# Patient Record
Sex: Female | Born: 1985 | Race: White | Hispanic: No | Marital: Married | Smoking: Never smoker
Health system: Southern US, Community
[De-identification: ages and names within clinical notes are randomized; demographics above are authoritative.]

## PROBLEM LIST (undated history)

## (undated) ENCOUNTER — Inpatient Hospital Stay (HOSPITAL_COMMUNITY): Payer: Self-pay

## (undated) DIAGNOSIS — Z789 Other specified health status: Secondary | ICD-10-CM

## (undated) DIAGNOSIS — O139 Gestational [pregnancy-induced] hypertension without significant proteinuria, unspecified trimester: Secondary | ICD-10-CM

## (undated) DIAGNOSIS — N83209 Unspecified ovarian cyst, unspecified side: Secondary | ICD-10-CM

## (undated) HISTORY — PX: NO PAST SURGERIES: SHX2092

## (undated) HISTORY — PX: WISDOM TOOTH EXTRACTION: SHX21

## (undated) HISTORY — PX: OTHER SURGICAL HISTORY: SHX169

---

## 2015-01-31 ENCOUNTER — Encounter: Payer: Self-pay | Admitting: Obstetrics and Gynecology

## 2015-05-20 NOTE — L&D Delivery Note (Signed)
Delivery Note Patient could not sit for epidural so Nitrous was given and a pudendal block was given. Patient then pushed for less than 30 minutes after she was noted to be C/C/+2. At 8:36 AM a viable and healthy female was delivered over an intact perineum ROA. APGAR:9/9 , ; weight pending. The umbilical cord was short so it was quickly double clamped and then cut by the father. Cord blood obtained. The placenta was spontaneously delivered intact, 3 vessels noted.  Patient's IV wasn't working so IM pitocin was given. Uterine atony continued despite massage so patient's IV was quickly replaced and IV pitocin administered. Several clots were removed from the uterus and hemostasis achieved.      Anesthesia:  Local Lidocaine / pudendal Lacerations: None  Suture Repair: N/A Est. Blood Loss (mL):  600 mL  Mom to postpartum.  Baby to Couplet care / Skin to Skin.  Essie HartINN, Ryan Palermo STACIA 02/13/2016, 9:03 AM

## 2015-07-31 LAB — OB RESULTS CONSOLE RUBELLA ANTIBODY, IGM: RUBELLA: IMMUNE

## 2015-07-31 LAB — OB RESULTS CONSOLE GC/CHLAMYDIA
CHLAMYDIA, DNA PROBE: NEGATIVE
Gonorrhea: NEGATIVE

## 2015-07-31 LAB — OB RESULTS CONSOLE RPR: RPR: NONREACTIVE

## 2015-07-31 LAB — OB RESULTS CONSOLE ANTIBODY SCREEN: ANTIBODY SCREEN: NEGATIVE

## 2015-07-31 LAB — OB RESULTS CONSOLE HEPATITIS B SURFACE ANTIGEN: HEP B S AG: NEGATIVE

## 2015-07-31 LAB — OB RESULTS CONSOLE ABO/RH: RH TYPE: POSITIVE

## 2015-07-31 LAB — OB RESULTS CONSOLE HIV ANTIBODY (ROUTINE TESTING): HIV: NONREACTIVE

## 2015-12-23 ENCOUNTER — Encounter (HOSPITAL_COMMUNITY): Payer: Self-pay

## 2015-12-23 ENCOUNTER — Inpatient Hospital Stay (HOSPITAL_COMMUNITY)
Admission: AD | Admit: 2015-12-23 | Discharge: 2015-12-23 | Disposition: A | Discharge status: Home / Self Care | Payer: 59 | Source: Ambulatory Visit | Attending: Obstetrics | Admitting: Obstetrics

## 2015-12-23 DIAGNOSIS — O368130 Decreased fetal movements, third trimester, not applicable or unspecified: Secondary | ICD-10-CM | POA: Diagnosis present

## 2015-12-23 DIAGNOSIS — O368131 Decreased fetal movements, third trimester, fetus 1: Secondary | ICD-10-CM

## 2015-12-23 DIAGNOSIS — Z3A31 31 weeks gestation of pregnancy: Secondary | ICD-10-CM | POA: Diagnosis not present

## 2015-12-23 HISTORY — DX: Other specified health status: Z78.9

## 2015-12-23 NOTE — Discharge Instructions (Signed)
Fetal Movement Counts  Patient Name: __________________________________________________ Patient Due Date: ____________________  Performing a fetal movement count is highly recommended in high-risk pregnancies, but it is good for every pregnant woman to do. Your health care provider may ask you to start counting fetal movements at 28 weeks of the pregnancy. Fetal movements often increase:  · After eating a full meal.  · After physical activity.  · After eating or drinking something sweet or cold.  · At rest.  Pay attention to when you feel the baby is most active. This will help you notice a pattern of your baby's sleep and wake cycles and what factors contribute to an increase in fetal movement. It is important to perform a fetal movement count at the same time each day when your baby is normally most active.   HOW TO COUNT FETAL MOVEMENTS  1. Find a quiet and comfortable area to sit or lie down on your left side. Lying on your left side provides the best blood and oxygen circulation to your baby.  2. Write down the day and time on a sheet of paper or in a journal.  3. Start counting kicks, flutters, swishes, rolls, or jabs in a 2-hour period. You should feel at least 10 movements within 2 hours.  4. If you do not feel 10 movements in 2 hours, wait 2-3 hours and count again. Look for a change in the pattern or not enough counts in 2 hours.  SEEK MEDICAL CARE IF:  · You feel less than 10 counts in 2 hours, tried twice.  · There is no movement in over an hour.  · The pattern is changing or taking longer each day to reach 10 counts in 2 hours.  · You feel the baby is not moving as he or she usually does.  Date: ____________ Movements: ____________ Start time: ____________ Finish time: ____________   Date: ____________ Movements: ____________ Start time: ____________ Finish time: ____________  Date: ____________ Movements: ____________ Start time: ____________ Finish time: ____________  Date: ____________ Movements:  ____________ Start time: ____________ Finish time: ____________  Date: ____________ Movements: ____________ Start time: ____________ Finish time: ____________  Date: ____________ Movements: ____________ Start time: ____________ Finish time: ____________  Date: ____________ Movements: ____________ Start time: ____________ Finish time: ____________  Date: ____________ Movements: ____________ Start time: ____________ Finish time: ____________   Date: ____________ Movements: ____________ Start time: ____________ Finish time: ____________  Date: ____________ Movements: ____________ Start time: ____________ Finish time: ____________  Date: ____________ Movements: ____________ Start time: ____________ Finish time: ____________  Date: ____________ Movements: ____________ Start time: ____________ Finish time: ____________  Date: ____________ Movements: ____________ Start time: ____________ Finish time: ____________  Date: ____________ Movements: ____________ Start time: ____________ Finish time: ____________  Date: ____________ Movements: ____________ Start time: ____________ Finish time: ____________   Date: ____________ Movements: ____________ Start time: ____________ Finish time: ____________  Date: ____________ Movements: ____________ Start time: ____________ Finish time: ____________  Date: ____________ Movements: ____________ Start time: ____________ Finish time: ____________  Date: ____________ Movements: ____________ Start time: ____________ Finish time: ____________  Date: ____________ Movements: ____________ Start time: ____________ Finish time: ____________  Date: ____________ Movements: ____________ Start time: ____________ Finish time: ____________  Date: ____________ Movements: ____________ Start time: ____________ Finish time: ____________   Date: ____________ Movements: ____________ Start time: ____________ Finish time: ____________  Date: ____________ Movements: ____________ Start time: ____________ Finish  time: ____________  Date: ____________ Movements: ____________ Start time: ____________ Finish time: ____________  Date: ____________ Movements: ____________ Start time:   ____________ Finish time: ____________  Date: ____________ Movements: ____________ Start time: ____________ Finish time: ____________  Date: ____________ Movements: ____________ Start time: ____________ Finish time: ____________  Date: ____________ Movements: ____________ Start time: ____________ Finish time: ____________   Date: ____________ Movements: ____________ Start time: ____________ Finish time: ____________  Date: ____________ Movements: ____________ Start time: ____________ Finish time: ____________  Date: ____________ Movements: ____________ Start time: ____________ Finish time: ____________  Date: ____________ Movements: ____________ Start time: ____________ Finish time: ____________  Date: ____________ Movements: ____________ Start time: ____________ Finish time: ____________  Date: ____________ Movements: ____________ Start time: ____________ Finish time: ____________  Date: ____________ Movements: ____________ Start time: ____________ Finish time: ____________   Date: ____________ Movements: ____________ Start time: ____________ Finish time: ____________  Date: ____________ Movements: ____________ Start time: ____________ Finish time: ____________  Date: ____________ Movements: ____________ Start time: ____________ Finish time: ____________  Date: ____________ Movements: ____________ Start time: ____________ Finish time: ____________  Date: ____________ Movements: ____________ Start time: ____________ Finish time: ____________  Date: ____________ Movements: ____________ Start time: ____________ Finish time: ____________  Date: ____________ Movements: ____________ Start time: ____________ Finish time: ____________   Date: ____________ Movements: ____________ Start time: ____________ Finish time: ____________  Date: ____________  Movements: ____________ Start time: ____________ Finish time: ____________  Date: ____________ Movements: ____________ Start time: ____________ Finish time: ____________  Date: ____________ Movements: ____________ Start time: ____________ Finish time: ____________  Date: ____________ Movements: ____________ Start time: ____________ Finish time: ____________  Date: ____________ Movements: ____________ Start time: ____________ Finish time: ____________  Date: ____________ Movements: ____________ Start time: ____________ Finish time: ____________   Date: ____________ Movements: ____________ Start time: ____________ Finish time: ____________  Date: ____________ Movements: ____________ Start time: ____________ Finish time: ____________  Date: ____________ Movements: ____________ Start time: ____________ Finish time: ____________  Date: ____________ Movements: ____________ Start time: ____________ Finish time: ____________  Date: ____________ Movements: ____________ Start time: ____________ Finish time: ____________  Date: ____________ Movements: ____________ Start time: ____________ Finish time: ____________     This information is not intended to replace advice given to you by your health care provider. Make sure you discuss any questions you have with your health care provider.     Document Released: 06/04/2006 Document Revised: 05/26/2014 Document Reviewed: 03/01/2012  Elsevier Interactive Patient Education ©2016 [employer]

## 2015-12-23 NOTE — MAU Provider Note (Signed)
Chief Complaint:  Decreased Fetal Movement   First Provider Initiated Contact with Patient 12/23/15 2204     HPI: Dana Aguilar is a 30 y.o. G3P1102 at 6298w0d who presents to maternity admissions reporting decreased fetal movement x 36 hours. Feeling mvmt less often and more toward her back than usual.   Modifying factors: No improvement w/ drinking caffeinated  drink Associated signs and symptoms: None  Denies contractions, leakage of fluid or vaginal bleeding.  Pregnancy Course: Uncomplicated  Past Medical History: Past Medical History:  Diagnosis Date  . Medical history non-contributory     Past obstetric history: OB History  Gravida Para Term Preterm AB Living  3 2 1 1   2   SAB TAB Ectopic Multiple Live Births          2    # Outcome Date GA Lbr Len/2nd Weight Sex Delivery Anes PTL Lv  3 Current           2 Term 2008 5830w0d       LIV  1 Preterm 2005 2279w0d    Vag-Spont   LIV      Past Surgical History: Past Surgical History:  Procedure Laterality Date  . varicose vein removal Left   . WISDOM TOOTH EXTRACTION       Family History: No family history on file.  Social History: Social History  Substance Use Topics  . Smoking status: Not on file  . Smokeless tobacco: Not on file  . Alcohol use Not on file    Allergies: No Known Allergies  Meds:  No prescriptions prior to admission.    I have reviewed patient's Past Medical Hx, Surgical Hx, Family Hx, Social Hx, medications and allergies.   ROS:  Review of Systems  Physical Exam  Patient Vitals for the past 24 hrs:  BP Temp Temp src Pulse Resp SpO2 Height Weight  12/23/15 2128 127/78 97.8 F (36.6 C) Oral 65 18 100 % - -  12/23/15 2117 - - - - - - 5' 8.5" (1.74 m) 192 lb (87.1 kg)   Constitutional: Well-developed, well-nourished female in no acute distress.  Cardiovascular: normal rate Respiratory: normal effort GI: Abd soft, non-tender, gravid appropriate for gestational age. Neurologic: Alert and  oriented x 4.  GU: Deferred  FHT:  Baseline 135 , moderate variability, accelerations present, no decelerations Contractions: None   Labs: No results found for this or any previous visit (from the past 24 hour(s)).  Imaging:  No results found.  MAU Course: NST reactive. Frequent fetal movement audible on EFM. Patient now feeling fetal movement.  Discussed with Dr. Dareen PianoAnderson.  MDM: - Decreased fetal movement, resolved. Reactive NST and active fetus.  Assessment: 1. Decreased fetal movement, third trimester, fetus 1     Plan: Discharge home in stable condition.  Labor precautions and fetal kick counts Follow-up Information    [facility].   Why:  as scheduled or sooner as needed if symptoms worsened Contact information: [address] [place] [address] [phone]        [facility] .   Why:  as needed in emergencies Contact information: [address] [id number] mc [place] [id number] [phone]            Medication List    You have not been prescribed any medications.     [place] Sherene Plancarte, [place] 12/23/2015 10:07 PM

## 2015-12-23 NOTE — MAU Note (Signed)
Pt is here with c/o decreased fetal movement for last 36 hour. Denies any bleeding or leaking of fluid. Denies any problems with the pregnancy.

## 2016-01-30 LAB — OB RESULTS CONSOLE GBS: GBS: NEGATIVE

## 2016-02-13 ENCOUNTER — Encounter (HOSPITAL_COMMUNITY): Payer: Self-pay

## 2016-02-13 ENCOUNTER — Inpatient Hospital Stay (HOSPITAL_COMMUNITY)
Admission: AD | Admit: 2016-02-13 | Discharge: 2016-02-14 | DRG: 774 | Disposition: A | Discharge status: Home / Self Care | Payer: 59 | Source: Ambulatory Visit | Attending: Obstetrics & Gynecology | Admitting: Obstetrics & Gynecology

## 2016-02-13 DIAGNOSIS — Z3A38 38 weeks gestation of pregnancy: Secondary | ICD-10-CM

## 2016-02-13 DIAGNOSIS — Z3483 Encounter for supervision of other normal pregnancy, third trimester: Secondary | ICD-10-CM | POA: Diagnosis present

## 2016-02-13 LAB — CBC
HEMATOCRIT: 38.7 % (ref 36.0–46.0)
Hemoglobin: 14 g/dL (ref 12.0–15.0)
MCH: 31.4 pg (ref 26.0–34.0)
MCHC: 36.2 g/dL — ABNORMAL HIGH (ref 30.0–36.0)
MCV: 86.8 fL (ref 78.0–100.0)
PLATELETS: 146 10*3/uL — AB (ref 150–400)
RBC: 4.46 MIL/uL (ref 3.87–5.11)
RDW: 13.3 % (ref 11.5–15.5)
WBC: 9.2 10*3/uL (ref 4.0–10.5)

## 2016-02-13 LAB — RPR: RPR Ser Ql: NONREACTIVE

## 2016-02-13 MED ORDER — PRENATAL MULTIVITAMIN CH
1.0000 | ORAL_TABLET | Freq: Every day | ORAL | Status: DC
  Administered 2016-02-14: 1 via ORAL
  Filled 2016-02-13: qty 1

## 2016-02-13 MED ORDER — COCONUT OIL OIL: 1.0000 "application " | TOPICAL_OIL | Status: DC | PRN

## 2016-02-13 MED ORDER — LACTATED RINGERS IV SOLN: 500.0000 mL | INTRAVENOUS | Status: DC | PRN

## 2016-02-13 MED ORDER — OXYTOCIN 40 UNITS IN LACTATED RINGERS INFUSION - SIMPLE MED
2.5000 [IU]/h | INTRAVENOUS | Status: DC
  Filled 2016-02-13: qty 1000

## 2016-02-13 MED ORDER — OXYTOCIN 40 UNITS IN LACTATED RINGERS INFUSION - SIMPLE MED: 2.5000 [IU]/h | INTRAVENOUS | Status: DC | PRN

## 2016-02-13 MED ORDER — DIBUCAINE 1 % RE OINT: 1.0000 "application " | TOPICAL_OINTMENT | RECTAL | Status: DC | PRN

## 2016-02-13 MED ORDER — ZOLPIDEM TARTRATE 5 MG PO TABS: 5.0000 mg | ORAL_TABLET | Freq: Every evening | ORAL | Status: DC | PRN

## 2016-02-13 MED ORDER — LIDOCAINE HCL (PF) 1 % IJ SOLN
30.0000 mL | INTRAMUSCULAR | Status: DC | PRN
  Administered 2016-02-13: 30 mL via SUBCUTANEOUS
  Filled 2016-02-13: qty 30

## 2016-02-13 MED ORDER — LACTATED RINGERS IV SOLN: 500.0000 mL | Freq: Once | INTRAVENOUS | Status: DC

## 2016-02-13 MED ORDER — DIPHENHYDRAMINE HCL 50 MG/ML IJ SOLN: 12.5000 mg | INTRAMUSCULAR | Status: DC | PRN

## 2016-02-13 MED ORDER — FENTANYL 2.5 MCG/ML BUPIVACAINE 1/10 % EPIDURAL INFUSION (WH - ANES)
14.0000 mL/h | INTRAMUSCULAR | Status: DC | PRN
  Filled 2016-02-13: qty 125

## 2016-02-13 MED ORDER — OXYTOCIN BOLUS FROM INFUSION
500.0000 mL | Freq: Once | INTRAVENOUS | Status: AC
  Administered 2016-02-13: 500 mL via INTRAVENOUS

## 2016-02-13 MED ORDER — PHENYLEPHRINE 40 MCG/ML (10ML) SYRINGE FOR IV PUSH (FOR BLOOD PRESSURE SUPPORT)
80.0000 ug | PREFILLED_SYRINGE | INTRAVENOUS | Status: DC | PRN
  Filled 2016-02-13: qty 5

## 2016-02-13 MED ORDER — ACETAMINOPHEN 325 MG PO TABS: 650.0000 mg | ORAL_TABLET | ORAL | Status: DC | PRN

## 2016-02-13 MED ORDER — FLEET ENEMA 7-19 GM/118ML RE ENEM: 1.0000 | ENEMA | RECTAL | Status: DC | PRN

## 2016-02-13 MED ORDER — DIPHENHYDRAMINE HCL 25 MG PO CAPS: 25.0000 mg | ORAL_CAPSULE | Freq: Four times a day (QID) | ORAL | Status: DC | PRN

## 2016-02-13 MED ORDER — ONDANSETRON HCL 4 MG/2ML IJ SOLN: 4.0000 mg | INTRAMUSCULAR | Status: DC | PRN

## 2016-02-13 MED ORDER — ONDANSETRON HCL 4 MG/2ML IJ SOLN: 4.0000 mg | Freq: Four times a day (QID) | INTRAMUSCULAR | Status: DC | PRN

## 2016-02-13 MED ORDER — SIMETHICONE 80 MG PO CHEW: 80.0000 mg | CHEWABLE_TABLET | ORAL | Status: DC | PRN

## 2016-02-13 MED ORDER — OXYTOCIN 10 UNIT/ML IJ SOLN
INTRAMUSCULAR | Status: AC
  Administered 2016-02-13: 10 [IU] via INTRAMUSCULAR
  Filled 2016-02-13: qty 1

## 2016-02-13 MED ORDER — IBUPROFEN 600 MG PO TABS
600.0000 mg | ORAL_TABLET | Freq: Four times a day (QID) | ORAL | Status: DC
  Administered 2016-02-13 – 2016-02-14 (×5): 600 mg via ORAL
  Filled 2016-02-13 (×5): qty 1

## 2016-02-13 MED ORDER — EPHEDRINE 5 MG/ML INJ
10.0000 mg | INTRAVENOUS | Status: DC | PRN
  Filled 2016-02-13: qty 4

## 2016-02-13 MED ORDER — SOD CITRATE-CITRIC ACID 500-334 MG/5ML PO SOLN: 30.0000 mL | ORAL | Status: DC | PRN

## 2016-02-13 MED ORDER — OXYCODONE-ACETAMINOPHEN 5-325 MG PO TABS: 2.0000 | ORAL_TABLET | ORAL | Status: DC | PRN

## 2016-02-13 MED ORDER — SENNOSIDES-DOCUSATE SODIUM 8.6-50 MG PO TABS
2.0000 | ORAL_TABLET | ORAL | Status: DC
  Administered 2016-02-13: 2 via ORAL
  Filled 2016-02-13: qty 2

## 2016-02-13 MED ORDER — OXYTOCIN 10 UNIT/ML IJ SOLN
10.0000 [IU] | Freq: Once | INTRAMUSCULAR | Status: AC
  Administered 2016-02-13: 10 [IU] via INTRAMUSCULAR

## 2016-02-13 MED ORDER — PHENYLEPHRINE 40 MCG/ML (10ML) SYRINGE FOR IV PUSH (FOR BLOOD PRESSURE SUPPORT)
80.0000 ug | PREFILLED_SYRINGE | INTRAVENOUS | Status: DC | PRN
  Filled 2016-02-13: qty 5
  Filled 2016-02-13: qty 10

## 2016-02-13 MED ORDER — TETANUS-DIPHTH-ACELL PERTUSSIS 5-2.5-18.5 LF-MCG/0.5 IM SUSP: 0.5000 mL | Freq: Once | INTRAMUSCULAR | Status: DC

## 2016-02-13 MED ORDER — OXYCODONE HCL 5 MG PO TABS: 5.0000 mg | ORAL_TABLET | ORAL | Status: DC | PRN

## 2016-02-13 MED ORDER — WITCH HAZEL-GLYCERIN EX PADS: 1.0000 "application " | MEDICATED_PAD | CUTANEOUS | Status: DC | PRN

## 2016-02-13 MED ORDER — OXYCODONE-ACETAMINOPHEN 5-325 MG PO TABS: 1.0000 | ORAL_TABLET | ORAL | Status: DC | PRN

## 2016-02-13 MED ORDER — ONDANSETRON HCL 4 MG PO TABS: 4.0000 mg | ORAL_TABLET | ORAL | Status: DC | PRN

## 2016-02-13 MED ORDER — OXYCODONE HCL 5 MG PO TABS: 10.0000 mg | ORAL_TABLET | ORAL | Status: DC | PRN

## 2016-02-13 MED ORDER — BENZOCAINE-MENTHOL 20-0.5 % EX AERO
1.0000 "application " | INHALATION_SPRAY | CUTANEOUS | Status: DC | PRN
  Administered 2016-02-13: 1 via TOPICAL
  Filled 2016-02-13: qty 56

## 2016-02-13 MED ORDER — LACTATED RINGERS IV SOLN: INTRAVENOUS | Status: DC

## 2016-02-13 NOTE — Lactation Note (Addendum)
This note was copied from a baby's chart. Lactation Consultation Note Initial visit at 8 hours of age.  Mom reports 2 good feedings. Mom denies pain or concerns at this time.  Mom reports limited experience with older child having a difficult latch and pumping for 4-6 weeks with formula supplementation. Baby is now asleep STS.  [facility] resources given and discussed.  Encouraged to feed with early cues on demand.  Early newborn behavior discussed.  Hand expression demonstrated and mom returned domonstration with colostrum visible.  Mom to call for assist as needed.  Mom reports a void and stool in 1st 8 hours of life.     Patient Name: Dana Aguilar [id number]'YToday's Date: 02/13/2016 Reason for consult: Initial assessment   Maternal Data Has patient been taught Hand Expression?: Yes Does the patient have breastfeeding experience prior to this delivery?: Yes  Feeding    LATCH Score/Interventions                Intervention(s): Skin to skin     Lactation Tools Discussed/Used     Consult Status Consult Status: Follow-up Date: 02/14/16 Follow-up type: In-patient    Beverely RisenShoptaw, Arvella MerlesJana Lynn 02/13/2016, 4:58 PM

## 2016-02-13 NOTE — H&P (Signed)
Ellwood DenseHolly Pontarelli is a 30 y.o. female presenting for regular painful contractions, no leaking of fluid, normal fetal movement.  OB History    Gravida Para Term Preterm AB Living   3 2 1 1   2    SAB TAB Ectopic Multiple Live Births           2     Past Medical History:  Diagnosis Date  . Medical history non-contributory    Past Surgical History:  Procedure Laterality Date  . varicose vein removal Left   . WISDOM TOOTH EXTRACTION     Family History: family history is not on file. Social History:  reports that she has never smoked. She has never used smokeless tobacco. She reports that she does not drink alcohol or use drugs.     Maternal Diabetes: No Genetic Screening: Declined Maternal Ultrasounds/Referrals: Normal Fetal Ultrasounds or other Referrals:  Other: Normal anatomy scan Maternal Substance Abuse:  No Significant Maternal Medications:  None Significant Maternal Lab Results:  Lab values include: Group B Strep negative Other Comments:  None  Review of Systems  All other systems reviewed and are negative.  History Dilation: 5-6 cm on admission Effacement (%): 100 Station: 0  Blood pressure 126/84, pulse 82, temperature 97.5 F (36.4 C), temperature source Axillary, resp. rate 20, height 5\' 9"  (1.753 m), weight 90.7 kg (200 lb). Exam Physical Exam  Prenatal labs: ABO, Rh: A/Positive/-- (03/14 0000) Antibody: Negative (03/14 0000) Rubella: Immune (03/14 0000) RPR: Nonreactive (03/14 0000)  HBsAg: Negative (03/14 0000)  HIV: Non-reactive (03/14 0000)  GBS: Negative (09/13 0000)   Assessment/Plan: 30 yo G3P1102 at 38 weeks 3 days in active labor Admit to L&D Continuous monitoring Epidural on demand  Nicholaos Schippers STACIA 02/13/2016, 8:53 AM

## 2016-02-13 NOTE — MAU Note (Signed)
Pt presents complaining of contractions that started 2 hours ago. Denies leaking or bleeding. Reports good fetal movement. 2cm in office last week.

## 2016-02-13 NOTE — Progress Notes (Signed)
Patient requested epidural however once pt sat up for epidural, was unable to remain in sitting position. Dr. Gentry RochJUdd spoke with patient regarding her options and opted for Nitrous Oxide.

## 2016-02-14 LAB — CBC
HEMATOCRIT: 33 % — AB (ref 36.0–46.0)
HEMOGLOBIN: 11.5 g/dL — AB (ref 12.0–15.0)
MCH: 30.8 pg (ref 26.0–34.0)
MCHC: 34.8 g/dL (ref 30.0–36.0)
MCV: 88.5 fL (ref 78.0–100.0)
Platelets: 119 10*3/uL — ABNORMAL LOW (ref 150–400)
RBC: 3.73 MIL/uL — ABNORMAL LOW (ref 3.87–5.11)
RDW: 13.6 % (ref 11.5–15.5)
WBC: 9.1 10*3/uL (ref 4.0–10.5)

## 2016-02-14 MED ORDER — IBUPROFEN 600 MG PO TABS: 600.0000 mg | ORAL_TABLET | Freq: Four times a day (QID) | ORAL | 0 refills | Status: DC | PRN

## 2016-02-14 NOTE — Discharge Summary (Signed)
Obstetric Discharge Summary Reason for Admission: onset of labor Prenatal Procedures: none Intrapartum Procedures: spontaneous vaginal delivery Postpartum Procedures: none Complications-Operative and Postpartum: none Hemoglobin  Date Value Ref Range Status  02/14/2016 11.5 (L) 12.0 - 15.0 g/dL Final   HCT  Date Value Ref Range Status  02/14/2016 33.0 (L) 36.0 - 46.0 % Final    Physical Exam:  General: alert, cooperative and appears stated age Lochia: appropriate Uterine Fundus: firm DVT Evaluation: No evidence of DVT seen on physical exam. Negative Homan's sign.  Discharge Diagnoses: Term Pregnancy-delivered  Discharge Information: Date: 02/14/2016 Activity: pelvic rest Diet: routine Medications: PNV and Ibuprofen Condition: stable Instructions: refer to practice specific booklet Discharge to: home Follow-up Information    PINN, Dana MaeWALDA STACIA, MD Follow up in 4 week(s).   Specialty:  Obstetrics and Gynecology Contact information: [address] [place] [id number] [phone]           Newborn Data: Live born female  Birth Weight: 7 lb 7.2 oz (3380 g) APGAR: 9, 9  Home with mother.  [facility] Center[facility] Aguilar Dana Aguilar 02/14/2016, 8:33 AM

## 2016-02-14 NOTE — Progress Notes (Signed)
Patient is doing well.  She is ambulating, voiding, tolerating PO.  Pain control is good.  Lochia is appropriate  Vitals:   02/13/16 0945 02/13/16 1010 02/13/16 1110 02/14/16 0601  BP: 131/89 133/84 124/76 127/64  Pulse: 60 (!) 56 60 61  Resp:  18 18 18   Temp:  98 F (36.7 C)  98.7 F (37.1 C)  TempSrc:    Oral  SpO2:  100% 100%   Weight:      Height:        NAD Fundus firm Ext: no edema  Lab Results  Component Value Date   WBC 9.1 02/14/2016   HGB 11.5 (L) 02/14/2016   HCT 33.0 (L) 02/14/2016   MCV 88.5 02/14/2016   PLT 119 (L) 02/14/2016    --/--/A POS (09/27 0740)/RImmune  A/P 30 y.o. [id number] PPD# 1 s/p TSVD Meeting all goals.  D/c to home today. BF well   Skyeler Scalese GEFFEL Reshad Saab

## 2016-02-14 NOTE — Lactation Note (Signed)
This note was copied from a baby's chart. Lactation Consultation Note  Patient Name: Girl Ellwood DenseHolly Muse ZOXWR'UToday's Date: 02/14/2016 Reason for consult: Follow-up assessment  Baby 24 hours old. Mom nursing baby in cradle position, baby's lips not flanged and baby pushing at breast and fussy. 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 Assisted mom to latch baby in football position to right breast and baby latched deeply and suckled rhythmically with intermittent swallows noted. Upper and lower lips remained flanged while baby nursed for 10 minutes, and baby no longer fighting at the breast. Mom is aware of OP/BFSG and LC phone line assistance after D/C. Mom states that she hopes to attend support group. Enc mom to call for assistance as needed.     Maternal Data    Feeding Feeding Type: Breast Fed Length of feed:  (LC assessed first 10 minutes of BF.)  LATCH Score/Interventions Latch: Grasps breast easily, tongue down, lips flanged, rhythmical sucking.  Audible Swallowing: Spontaneous and intermittent  Type of Nipple: Everted at rest and after stimulation  Comfort (Breast/Nipple): Soft / non-tender     Hold (Positioning): Assistance needed to correctly position infant at breast and maintain latch. Intervention(s): Breastfeeding basics reviewed;Support Pillows;Position options;Skin to skin  LATCH Score: 9  Lactation Tools Discussed/Used     Consult Status      Sherlyn HayJennifer D Matthew Cina 02/14/2016, 9:07 AM

## 2016-02-16 LAB — TYPE AND SCREEN
ABO/RH(D): A POS
ANTIBODY SCREEN: POSITIVE
DAT, IGG: NEGATIVE
UNIT DIVISION: 0
Unit division: 0

## 2020-02-23 ENCOUNTER — Other Ambulatory Visit: Payer: 59

## 2020-07-25 ENCOUNTER — Ambulatory Visit: Payer: No Typology Code available for payment source | Admitting: Psychology

## 2020-08-20 ENCOUNTER — Inpatient Hospital Stay (HOSPITAL_COMMUNITY)
Admission: AD | Admit: 2020-08-20 | Discharge: 2020-08-20 | Disposition: A | Discharge status: Home / Self Care | Payer: No Typology Code available for payment source | Attending: Obstetrics and Gynecology | Admitting: Obstetrics and Gynecology

## 2020-08-20 ENCOUNTER — Inpatient Hospital Stay (HOSPITAL_COMMUNITY): Payer: No Typology Code available for payment source

## 2020-08-20 ENCOUNTER — Encounter (HOSPITAL_COMMUNITY): Payer: Self-pay | Admitting: Obstetrics and Gynecology

## 2020-08-20 DIAGNOSIS — O09521 Supervision of elderly multigravida, first trimester: Secondary | ICD-10-CM | POA: Insufficient documentation

## 2020-08-20 DIAGNOSIS — R109 Unspecified abdominal pain: Secondary | ICD-10-CM

## 2020-08-20 DIAGNOSIS — O3680X0 Pregnancy with inconclusive fetal viability, not applicable or unspecified: Secondary | ICD-10-CM | POA: Diagnosis not present

## 2020-08-20 DIAGNOSIS — Z3A01 Less than 8 weeks gestation of pregnancy: Secondary | ICD-10-CM | POA: Insufficient documentation

## 2020-08-20 DIAGNOSIS — R1032 Left lower quadrant pain: Secondary | ICD-10-CM | POA: Diagnosis not present

## 2020-08-20 DIAGNOSIS — O209 Hemorrhage in early pregnancy, unspecified: Secondary | ICD-10-CM | POA: Diagnosis not present

## 2020-08-20 DIAGNOSIS — O26891 Other specified pregnancy related conditions, first trimester: Secondary | ICD-10-CM | POA: Diagnosis not present

## 2020-08-20 DIAGNOSIS — N939 Abnormal uterine and vaginal bleeding, unspecified: Secondary | ICD-10-CM

## 2020-08-20 HISTORY — DX: Unspecified ovarian cyst, unspecified side: N83.209

## 2020-08-20 HISTORY — DX: Gestational (pregnancy-induced) hypertension without significant proteinuria, unspecified trimester: O13.9

## 2020-08-20 LAB — COMPREHENSIVE METABOLIC PANEL
ALT: 16 U/L (ref 0–44)
AST: 20 U/L (ref 15–41)
Albumin: 3.8 g/dL (ref 3.5–5.0)
Alkaline Phosphatase: 36 U/L — ABNORMAL LOW (ref 38–126)
Anion gap: 7 (ref 5–15)
BUN: 12 mg/dL (ref 6–20)
CO2: 27 mmol/L (ref 22–32)
Calcium: 8.8 mg/dL — ABNORMAL LOW (ref 8.9–10.3)
Chloride: 103 mmol/L (ref 98–111)
Creatinine, Ser: 0.78 mg/dL (ref 0.44–1.00)
GFR, Estimated: >60 mL/min (ref >60)
Glucose, Bld: 95 mg/dL (ref 70–99)
Potassium: 3.2 mmol/L — ABNORMAL LOW (ref 3.5–5.1)
Sodium: 137 mmol/L (ref 135–145)
Total Bilirubin: 0.4 mg/dL (ref 0.3–1.2)
Total Protein: 6.2 g/dL — ABNORMAL LOW (ref 6.5–8.1)

## 2020-08-20 LAB — URINALYSIS, ROUTINE W REFLEX MICROSCOPIC
Bacteria, UA: NONE SEEN
Bilirubin Urine: NEGATIVE
Glucose, UA: NEGATIVE mg/dL
Ketones, ur: NEGATIVE mg/dL
Leukocytes,Ua: NEGATIVE
Nitrite: NEGATIVE
Protein, ur: NEGATIVE mg/dL
Specific Gravity, Urine: 1.008 (ref 1.005–1.030)
pH: 7 (ref 5.0–8.0)

## 2020-08-20 LAB — CBC
HCT: 38.8 % (ref 36.0–46.0)
Hemoglobin: 13.2 g/dL (ref 12.0–15.0)
MCH: 30.2 pg (ref 26.0–34.0)
MCHC: 34 g/dL (ref 30.0–36.0)
MCV: 88.8 fL (ref 80.0–100.0)
Platelets: 172 10*3/uL (ref 150–400)
RBC: 4.37 MIL/uL (ref 3.87–5.11)
RDW: 12.1 % (ref 11.5–15.5)
WBC: 5.8 10*3/uL (ref 4.0–10.5)
nRBC: 0 % (ref 0.0–0.2)

## 2020-08-20 LAB — WET PREP, GENITAL
Clue Cells Wet Prep HPF POC: NONE SEEN
Sperm: NONE SEEN
Trich, Wet Prep: NONE SEEN
Yeast Wet Prep HPF POC: NONE SEEN

## 2020-08-20 LAB — HCG, QUANTITATIVE, PREGNANCY: hCG, Beta Chain, Quant, S: 731 m[IU]/mL — ABNORMAL HIGH (ref <5)

## 2020-08-20 NOTE — MAU Provider Note (Signed)
History    CSN: [id number]  Arrival date and time: 08/20/20 1537  Event Date/Time  First Provider Initiated Contact with Patient 08/20/20 1632     Chief Complaint  Patient presents with  . Vaginal Bleeding   HPI Dana Aguilar is a 35 y.o. [phone] at [redacted]w[redacted]d by certain LMP who presents to MAU with chief complaints of vaginal bleeding and lower abdominal cramping.  Abdominal cramping This is a new problem onset about two weeks ago with recent increase in intensity. Originally patient experienced suprapubic and LLQ pain which she rated 2-3/10. Now it is 5-6/10 with occasional "sharp" twinges of very intense pain. Pain is mildly aggravated by applying pressure. She denies alleviating factors. She has not taken medication or tried other treatments for this complaint.  Vaginal bleeding This is a new problem, onset this morning and increasing over time. Patient denies heavy vaginal bleeding. She is not donning a pad, is not saturating her underwear or pants. She is not experiencing accompanying acute symptoms such as dizziness, weakness, syncope.  Patient has an appointment with [facility] on Monday 08/27/2020.  OB History    Gravida  4   Para  3   Term  2   Preterm  1   AB      Living  3     SAB      IAB      Ectopic      Multiple  0   Live Births  3           Past Medical History:  Diagnosis Date  . Medical history non-contributory   . Ovarian cyst   . Pregnancy induced hypertension    #2  . Preterm labor    #1    Past Surgical History:  Procedure Laterality Date  . NO PAST SURGERIES    . varicose vein removal Left   . WISDOM TOOTH EXTRACTION      Family History  Problem Relation Age of Onset  . Lupus Mother   . Hypertension Father     Social History   Tobacco Use  . Smoking status: Never Smoker  . Smokeless tobacco: Never Used  Vaping Use  . Vaping Use: Never used  Substance Use Topics  . Alcohol use: Not Currently  . Drug use: No     Allergies: No Known Allergies  Medications Prior to Admission  Medication Sig Dispense Refill Last Dose  . acetaminophen (TYLENOL) 500 MG tablet Take 1,000 mg by mouth every 6 (six) hours as needed.   Past Week at Unknown time  . Prenatal Vit-Fe Fumarate-FA (PRENATAL MULTIVITAMIN) TABS tablet Take 1 tablet by mouth daily at 12 noon.   08/19/2020 at Unknown time  . ibuprofen (ADVIL,MOTRIN) 600 MG tablet Take 1 tablet (600 mg total) by mouth every 6 (six) hours as needed for moderate pain or cramping. 40 tablet 0     Review of Systems  Gastrointestinal: Positive for abdominal pain.  Genitourinary: Positive for vaginal bleeding.  All other systems reviewed and are negative.  Physical Exam   Blood pressure (!) 145/89, pulse 63, temperature 98.9 F (37.2 C), temperature source Oral, resp. rate 18, height 5\' 9"  (1.753 m), weight 77.3 kg, last menstrual period 07/01/2020, SpO2 100 %, unknown if currently breastfeeding.  Physical Exam Vitals and nursing note reviewed. Exam conducted with a chaperone present.  Constitutional:      Appearance: Normal appearance.  Cardiovascular:     Pulses: Normal pulses.     Heart  sounds: Normal heart sounds.  Pulmonary:     Effort: Pulmonary effort is normal.     Breath sounds: Normal breath sounds.  Abdominal:     General: Abdomen is flat.     Tenderness: There is abdominal tenderness. There is no right CVA tenderness or left CVA tenderness.  Skin:    Capillary Refill: Capillary refill takes less than 2 seconds.  Neurological:     Mental Status: She is alert and oriented to person, place, and time.  Psychiatric:        Mood and Affect: Mood normal.        Thought Content: Thought content normal.    MAU Course  Procedures   --Workup discussed with Dr. Adrian Blackwater, who advises repeat imaging in seven days.   Orders Placed This Encounter  Procedures  . Wet prep, genital    Standing Status:   Standing    Number of Occurrences:   1  . US OB  LESS THAN 14 WEEKS WITH OB TRANSVAGINAL    No previous imaging this pregnancy. Abdominal pain x 2 weeks, new spotting today, quant hCG collected    Standing Status:   Standing    Number of Occurrences:   1    Order Specific Question:   Symptom/Reason for Exam    Answer:   LLQ abdominal pain [[id number]]    Order Specific Question:   Symptom/Reason for Exam    Answer:   Vaginal spotting [[id number]]  . Urinalysis, Routine w reflex microscopic Urine, Clean Catch    Standing Status:   Standing    Number of Occurrences:   1  . CBC    Standing Status:   Standing    Number of Occurrences:   1  . Comprehensive metabolic panel    Standing Status:   Standing    Number of Occurrences:   1  . hCG, quantitative, pregnancy    Standing Status:   Standing    Number of Occurrences:   1  . Nursing communication    Patient may self collect vaginal swabs    Standing Status:   Standing    Number of Occurrences:   1  . Discharge patient    Order Specific Question:   Discharge disposition    Answer:   01-Home or Self Care [1]    Order Specific Question:   Discharge patient date    Answer:   08/20/2020   Results for orders placed or performed during the hospital encounter of 08/20/20 (from the past 24 hour(s))  Urinalysis, Routine w reflex microscopic Urine, Clean Catch     Status: Abnormal   Collection Time: 08/20/20  4:14 PM  Result Value Ref Range   Color, Urine STRAW (A) YELLOW   APPearance CLEAR CLEAR   Specific Gravity, Urine 1.008 1.005 - 1.030   pH 7.0 5.0 - 8.0   Glucose, UA NEGATIVE NEGATIVE mg/dL   Hgb urine dipstick LARGE (A) NEGATIVE   Bilirubin Urine NEGATIVE NEGATIVE   Ketones, ur NEGATIVE NEGATIVE mg/dL   Protein, ur NEGATIVE NEGATIVE mg/dL   Nitrite NEGATIVE NEGATIVE   Leukocytes,Ua NEGATIVE NEGATIVE   RBC / HPF 0-5 0 - 5 RBC/hpf   WBC, UA 0-5 0 - 5 WBC/hpf   Bacteria, UA NONE SEEN NONE SEEN   Squamous Epithelial / LPF 0-5 0 - 5  CBC     Status: None   Collection Time: 08/20/20   4:16 PM  Result Value Ref Range   WBC 5.8 4.0 -  10.5 K/uL   RBC 4.37 3.87 - 5.11 MIL/uL   Hemoglobin 13.2 12.0 - 15.0 g/dL   HCT 52.838.8 41.336.0 - 24.446.0 %   MCV 88.8 80.0 - 100.0 fL   MCH 30.2 26.0 - 34.0 pg   MCHC 34.0 30.0 - 36.0 g/dL   RDW 01.012.1 27.211.5 - 53.615.5 %   Platelets 172 150 - 400 K/uL   nRBC 0.0 0.0 - 0.2 %  Comprehensive metabolic panel     Status: Abnormal   Collection Time: 08/20/20  4:16 PM  Result Value Ref Range   Sodium 137 135 - 145 mmol/L   Potassium 3.2 (L) 3.5 - 5.1 mmol/L   Chloride 103 98 - 111 mmol/L   CO2 27 22 - 32 mmol/L   Glucose, Bld 95 70 - 99 mg/dL   BUN 12 6 - 20 mg/dL   Creatinine, Ser 6.440.78 0.44 - 1.00 mg/dL   Calcium 8.8 (L) 8.9 - 10.3 mg/dL   Total Protein 6.2 (L) 6.5 - 8.1 g/dL   Albumin 3.8 3.5 - 5.0 g/dL   AST 20 15 - 41 U/L   ALT 16 0 - 44 U/L   Alkaline Phosphatase 36 (L) 38 - 126 U/L   Total Bilirubin 0.4 0.3 - 1.2 mg/dL   GFR, Estimated >03>60 >47>60 mL/min   Anion gap 7 5 - 15  hCG, quantitative, pregnancy     Status: Abnormal   Collection Time: 08/20/20  4:16 PM  Result Value Ref Range   hCG, Beta Chain, Quant, S 731 (H) <5 mIU/mL  Wet prep, genital     Status: Abnormal   Collection Time: 08/20/20  4:50 PM  Result Value Ref Range   Yeast Wet Prep HPF POC NONE SEEN NONE SEEN   Trich, Wet Prep NONE SEEN NONE SEEN   Clue Cells Wet Prep HPF POC NONE SEEN NONE SEEN   WBC, Wet Prep HPF POC MANY (A) NONE SEEN   Sperm NONE SEEN    US OB LESS THAN 14 WEEKS WITH OB TRANSVAGINAL  Result Date: 08/20/2020 CLINICAL DATA:  35 year old pregnant female with abdominal pain and 2 weeks of vaginal spotting. EXAM: OBSTETRIC <14 WK ULTRASOUND TECHNIQUE: Transabdominal ultrasound was performed for evaluation of the gestation as well as the maternal uterus and adnexal regions. COMPARISON:  No priors. FINDINGS: Intrauterine gestational sac: Single. Yolk sac:  None. Embryo:  Present Cardiac Activity: None. Heart Rate: N/A CRL:   5.4 mm   6 w 1 d                  US EDC:  04/14/2021 Subchorionic hemorrhage:  None visualized. Maternal uterus/adnexae: Right ovary is not visualized. Anechoic lesion with increased through transmission in the left ovary measuring 5.4 x 3.7 x 5.1 cm, compatible with a large cyst. Trace volume of free fluid in the cul-de-sac. IMPRESSION: 1. Findings are suspicious but not yet definitive for failed pregnancy. Recommend follow-up US in 10-14 days for definitive diagnosis. This recommendation follows SRU consensus guidelines: Diagnostic Criteria for Nonviable Pregnancy Early in the First Trimester. Malva Limes Engl J Med 2013; 425:9563-87369:1443-51. 2. 5.4 x 3.7 x 5.1 cm simple cyst in the left ovary. 3. Limited study which was unable to visualize the right ovary. Electronically Signed   By: Trudie Reedaniel  Entrikin M.D.   On: 08/20/2020 18:27   Assessment and Plan  --35 y.o. G4P2103 at 6962w1d by certain LMP - Quant hCG 731 --Pregnancy of unknown location, uncertain viability --Blood type A POS --Discharge home  in stable condition with bleeding precautions  F/U: -Patient to have repeat ultrasound 08/27/2020 at [facility] [place], [place] 08/20/2020, 8:12 PM

## 2020-08-20 NOTE — MAU Note (Signed)
Found out last wk that she is preg, confirmed at primary office. Has had some cramping for past 2 wks. Has stayed, worse at times.  Started spotting this morning, has gotten heavier.  Never had anything like this today. Conceived on birth control pills

## 2020-08-20 NOTE — Discharge Instructions (Signed)
Concern for Nonviable Pregnancy Most miscarriages happen in the first 3 months of pregnancy. Having a miscarriage can be an emotional experience. If you have had a miscarriage, talk with your health care provider about any questions you may have about:  The loss of your baby.  The grieving process.  Your future pregnancy plans. What are the causes? Many times, the cause of an incomplete miscarriage is not known. What increases the risk? The following factors may make a pregnant woman more likely to have an incomplete miscarriage:  Using a watch-and-wait approach (expectant management) to treat a miscarriage.  Using medicine to treat a miscarriage once it is diagnosed. What are the signs or symptoms of a miscarriage? Symptoms of this condition include:  Vaginal bleeding or spotting, with or without cramps or pain.  Pain or cramping in the abdomen or lower back.  Fluid or tissue coming out of the vagina. How is this diagnosed? This condition may be diagnosed based on:  A physical exam.  Ultrasound. How is this treated? An incomplete miscarriage may be treated with:  Dilation and curettage (D&C). In this procedure, the cervix is stretched open and any remaining pregnancy tissue is removed from the lining of the uterus (endometrium). The cervix is the lowest part of the uterus, which opens into the vagina.  Medicines. These may include: ? Antibiotic medicine, to treat infection. ? Medicine to help any remaining pregnancy tissue come out of the uterus. ? Medicine to reduce (contract) the size of the uterus. These medicines may be given if there is a lot of bleeding. If you have Rh-negative blood, you may be given an injection of Rho(D) immune globulin to help prevent problems with future pregnancies. Follow these instructions at home: Medicines  Take over-the-counter and prescription medicines only as told by your health care provider.  If you were prescribed antibiotic  medicine, take your antibiotic as told by your health care provider. Do not stop taking the antibiotic even if you start to feel better. Activity  Rest as told by your health care provider. Ask your health care provider what activities are safe for you.  Have someone help with home and family responsibilities during this time. General instructions  Monitor how much tissue or blood comes out of the vagina.  Do not have sex, douche, or put anything in your vagina, such as tampons, until your health care provider says it is okay.  To help you and your partner with the grieving process, talk with your health care provider or get counseling to help deal with the pregnancy loss.  When you are ready, meet with your health care provider to discuss any important steps you should take for your health. Also, discuss steps you should take to have a healthy pregnancy in the future.  Keep all follow-up visits. This is important.   Where to find more information  The [employer]necologists: acog.org  U.[place]. Department of Health and Cytogeneticist of Women's Health: http://hoffman.com/ Contact a health care provider if:  You have a fever or chills.  There is bad-smelling fluid coming from your vagina.  You have more bleeding instead of less.  Tissue or blood clots come out of your vagina. Get help right away if:  You have severe cramps or pain in your back or abdomen.  Heavy bleeding soaks through 2 large sanitary pads an hour for more than 2 hours.  You become light-headed or weak.  You faint.  You feel  sad, and your sadness takes over your thoughts.  You think about hurting yourself. If you ever feel like you may hurt yourself or others, or have thoughts about taking your own life, get help right away. Go to your nearest emergency department or:  Call your local emergency services (911 in the [place]  Call a suicide crisis helpline, such as  the National Suicide Prevention Lifeline at (787) 563-0839. This is open 24 hours a day in the [place]  Text the Crisis Text Line at 4407023400 (in the [place] Summary  An incomplete miscarriage happens when tissue from pregnancy or part of the placenta, known as products of conception, remain in the body after a miscarriage.  Treatment may include a dilation and curettage (D&C) procedure or medicines. In a D&C procedure, tissue is removed from the uterus.  Rest as told by your health care provider. Ask your health care provider what activities are safe for you.  To help you and your partner with the grieving process, talk with your health care provider or get counseling to help deal with the pregnancy loss. This information is not intended to replace advice given to you by your health care provider. Make sure you discuss any questions you have with your health care provider. Document Revised: 11/04/2019 Document Reviewed: 11/04/2019 Elsevier Patient Education  2021 [employer].

## 2020-08-21 LAB — GC/CHLAMYDIA PROBE AMP (~~LOC~~) NOT AT ARMC
Chlamydia: NEGATIVE
Comment: NEGATIVE
Comment: NORMAL
Neisseria Gonorrhea: NEGATIVE

## 2021-10-16 ENCOUNTER — Encounter (INDEPENDENT_AMBULATORY_CARE_PROVIDER_SITE_OTHER): Payer: BC Managed Care – PPO | Admitting: Ophthalmology

## 2021-11-08 IMAGING — US US OB < 14 WEEKS - US OB TV
2 series · 15 of 28 positions shown · non-contrast
Comparison: No priors.

CLINICAL DATA: 35-year-old pregnant female with abdominal pain and
2 weeks of vaginal spotting.

EXAM:
OBSTETRIC <14 WK ULTRASOUND
TECHNIQUE: Transabdominal ultrasound was performed for evaluation of the
gestation as well as the maternal uterus and adnexal regions.

[Series 1: us ob < 14 weeks - us ob tv · 13 of 40 slices shown (1 of 2)]
[im 1/40]
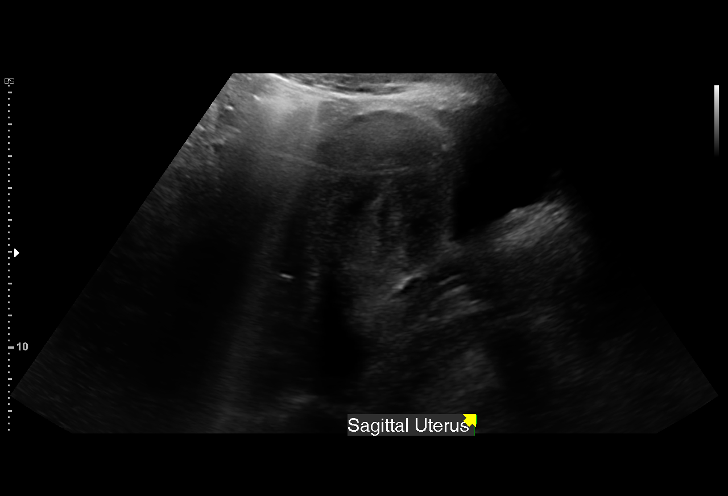
[im 4/40]
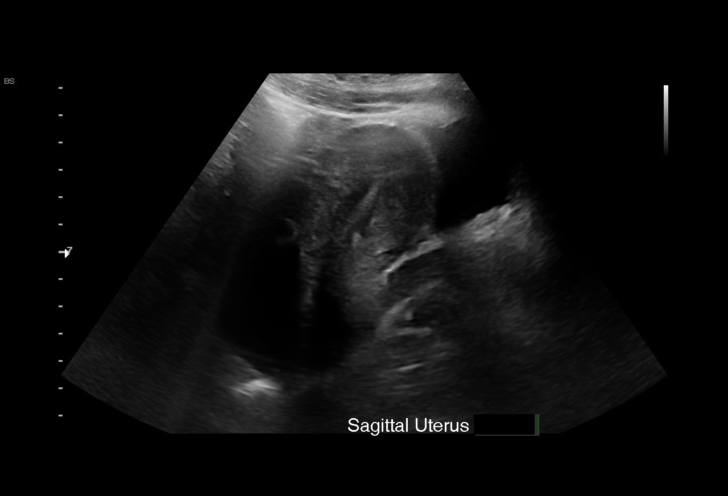
[im 7/40]
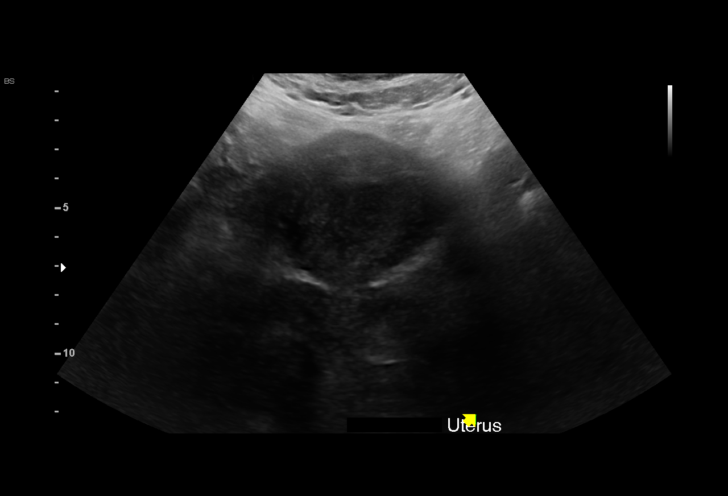
[im 10/40]
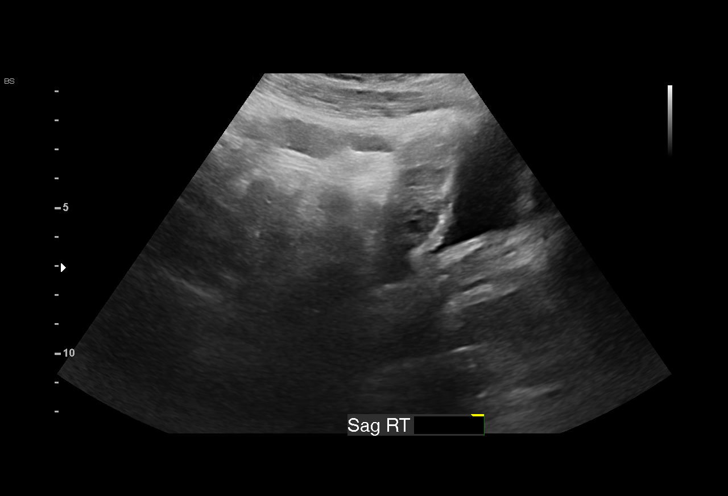
[im 14/40]
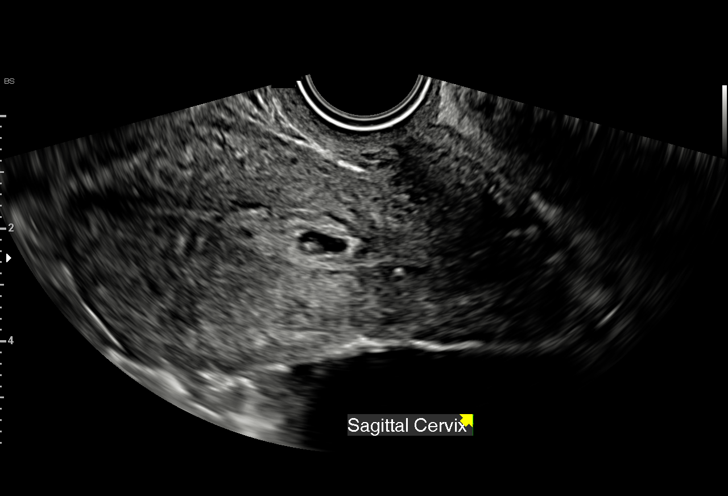
[im 17/40]
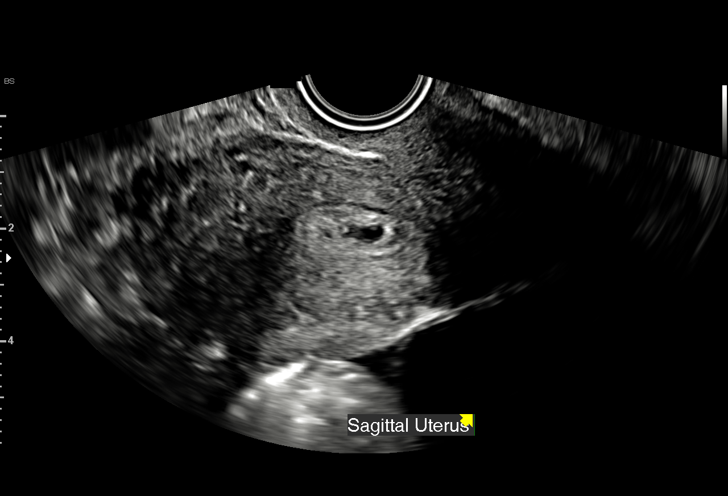
[im 20/40]
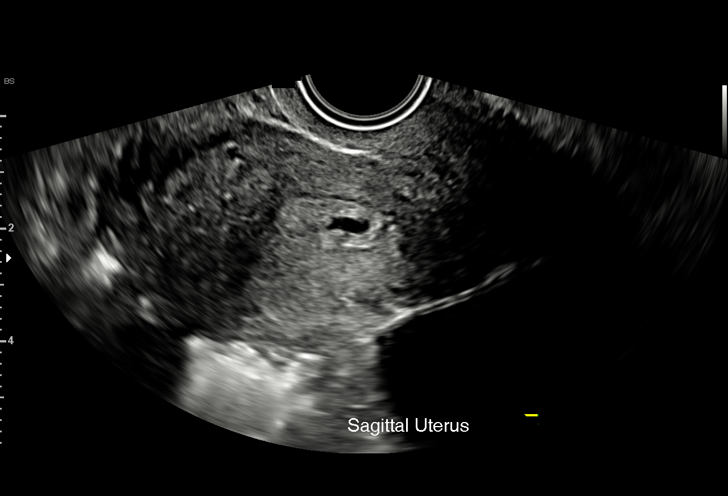
[im 23/40]
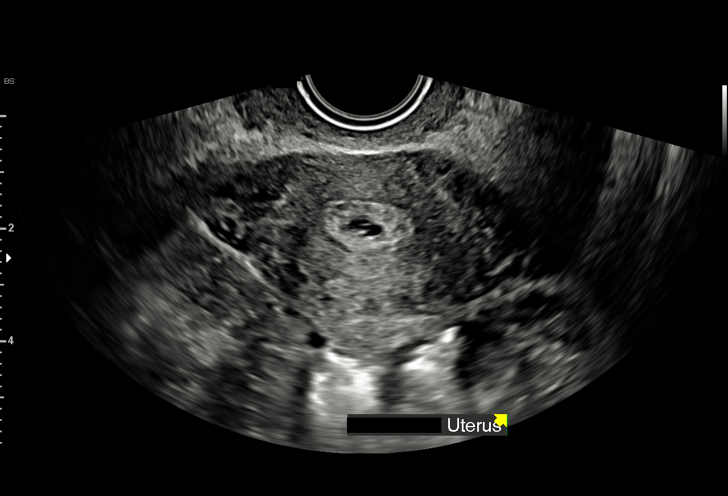
[im 25/40]
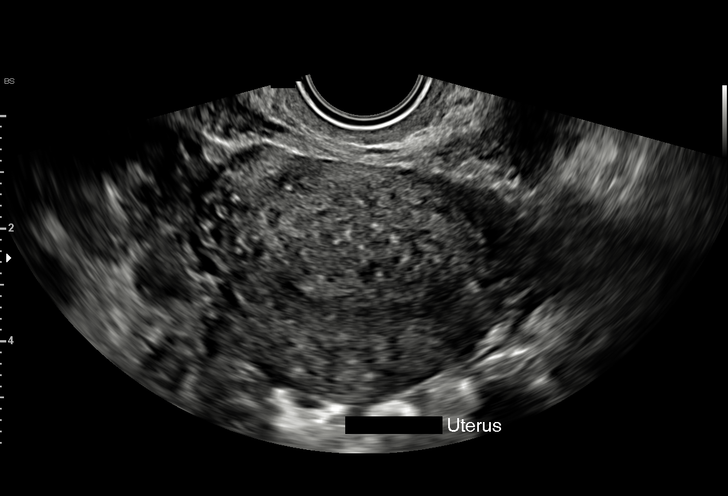
[im 28/40]
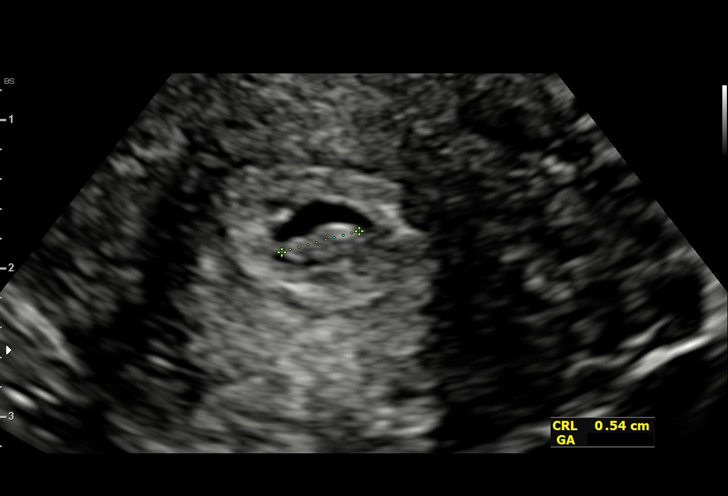
[im 31/40]
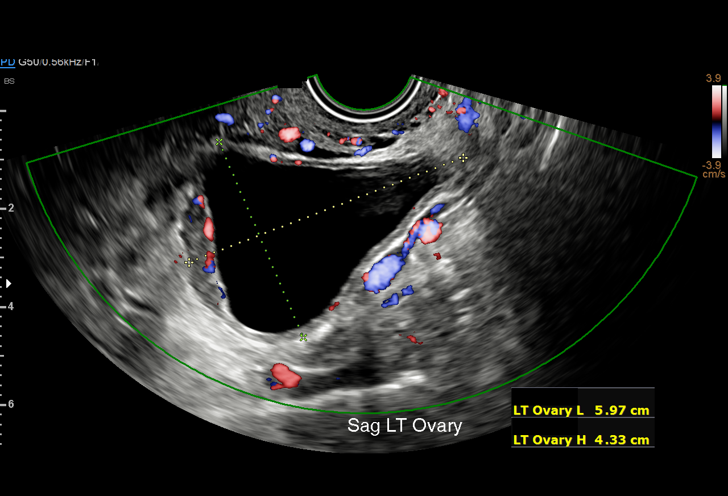
[im 35/40]
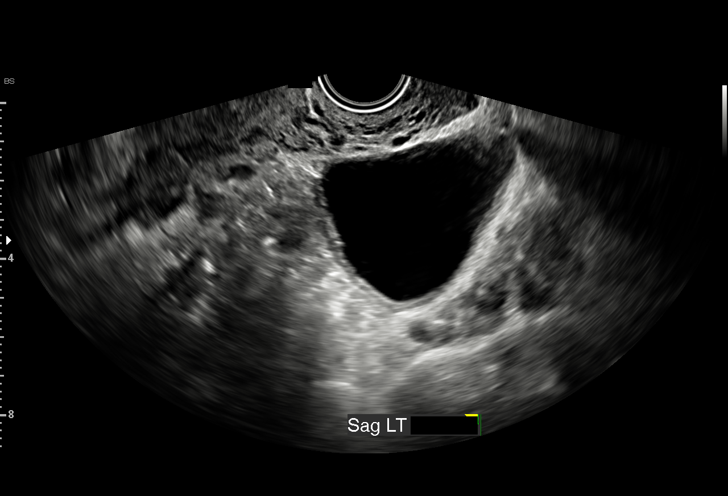
[im 38/40]
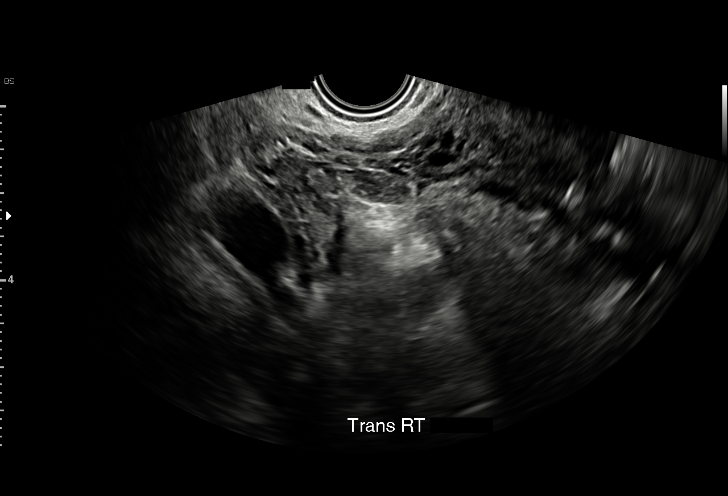

[Series 4: us ob < 14 weeks - us ob tv · 2 of 4 slices shown (2 of 2)]
[im 1/4]
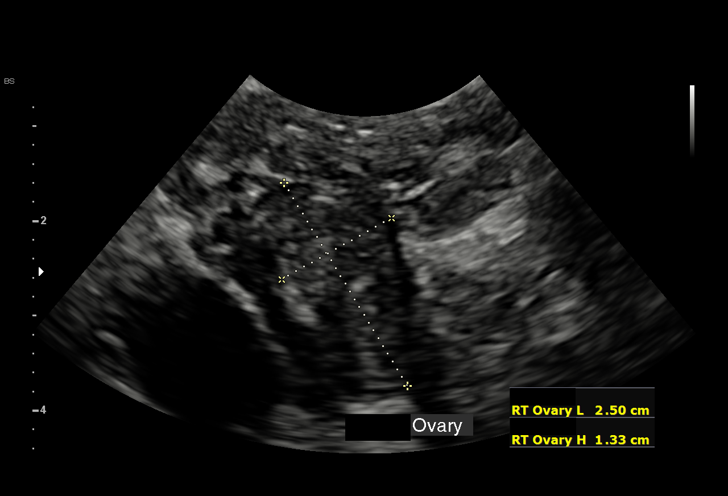
[im 4/4]
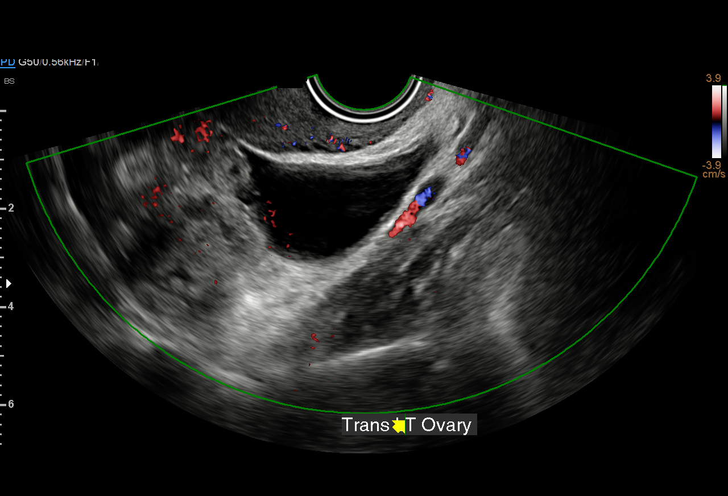

[15 of 28 positions shown; findings below may reference images not displayed]

FINDINGS: Intrauterine gestational sac: Single.

Yolk sac:  None.

Embryo:  Present

Cardiac Activity: None.

Heart Rate: N/A

CRL:   5.4 mm   6 w 1 d                  US EDC: 04/14/2021

Subchorionic hemorrhage:  None visualized.

Maternal uterus/adnexae: Right ovary is not visualized. Anechoic
lesion with increased through transmission in the left ovary
measuring 5.4 x 3.7 x 5.1 cm, compatible with a large cyst. Trace
volume of free fluid in the cul-de-sac.
IMPRESSION: 1. Findings are suspicious but not yet definitive for failed
pregnancy. Recommend follow-up US in 10-14 days for definitive
diagnosis. This recommendation follows SRU consensus guidelines:
Diagnostic Criteria for Nonviable Pregnancy Early in the First
Trimester. N Engl J Med 8711; [DATE].
2. 5.4 x 3.7 x 5.1 cm simple cyst in the left ovary.
3. Limited study which was unable to visualize the right ovary.
